# Patient Record
Sex: Male | Born: 1945 | Race: White | Hispanic: Yes | Marital: Married | Smoking: Never smoker
Health system: Northeastern US, Community
[De-identification: ages and names within clinical notes are randomized; demographics above are authoritative.]

---

## 2002-10-02 ENCOUNTER — Ambulatory Visit (HOSPITAL_BASED_OUTPATIENT_CLINIC_OR_DEPARTMENT_OTHER): Payer: Self-pay

## 2002-10-09 ENCOUNTER — Ambulatory Visit (HOSPITAL_BASED_OUTPATIENT_CLINIC_OR_DEPARTMENT_OTHER): Payer: Self-pay

## 2002-10-22 ENCOUNTER — Ambulatory Visit (HOSPITAL_BASED_OUTPATIENT_CLINIC_OR_DEPARTMENT_OTHER): Payer: Self-pay | Admitting: Otolaryngology

## 2003-01-21 ENCOUNTER — Ambulatory Visit (HOSPITAL_BASED_OUTPATIENT_CLINIC_OR_DEPARTMENT_OTHER): Payer: Self-pay | Admitting: Otolaryngology

## 2003-10-06 ENCOUNTER — Ambulatory Visit (HOSPITAL_BASED_OUTPATIENT_CLINIC_OR_DEPARTMENT_OTHER): Payer: Self-pay | Admitting: Otolaryngology

## 2006-12-14 ENCOUNTER — Ambulatory Visit (HOSPITAL_BASED_OUTPATIENT_CLINIC_OR_DEPARTMENT_OTHER): Payer: PRIVATE HEALTH INSURANCE | Admitting: Otolaryngology

## 2006-12-27 ENCOUNTER — Ambulatory Visit (HOSPITAL_BASED_OUTPATIENT_CLINIC_OR_DEPARTMENT_OTHER): Payer: Commercial Managed Care - HMO | Admitting: Ent-Otolaryngology

## 2007-01-31 LAB — SURG SPEC CLINIC NOTE

## 2009-07-15 DIAGNOSIS — R42 Dizziness and giddiness: Secondary | ICD-10-CM | POA: Insufficient documentation

## 2010-07-29 DIAGNOSIS — R42 Dizziness and giddiness: Secondary | ICD-10-CM | POA: Insufficient documentation

## 2014-08-05 DIAGNOSIS — R5383 Other fatigue: Secondary | ICD-10-CM | POA: Insufficient documentation

## 2016-08-09 DIAGNOSIS — H18519 Endothelial corneal dystrophy, unspecified eye: Secondary | ICD-10-CM | POA: Insufficient documentation

## 2018-08-28 LAB — COVID-19 CARE EVERYWHERE: COVID-19 CARE EVERYWHERE: NOT DETECTED

## 2022-04-29 IMAGING — CT CT ABDOMEN AND PELVIS WITHOUT CONTRAST
2 of 3 series · 16 of 46 positions shown, 18 images · non-contrast
Comparison: None

________________________________________________________________________________________________ 
CT ABDOMEN AND PELVIS WITHOUT CONTRAST, 04/29/2022 [DATE]: 
CLINICAL INDICATION: Calculus of ureter. Previous history of kidney stones 5 
years ago. New 
Baseline. 
A search for DICOM formatted images was conducted for prior CT imaging studies 
completed at a non-affiliated media free facility.
TECHNIQUE: The abdomen and pelvis were scanned from lung bases through the 
pubic rami without contrast on a high-resolution CT scanner using dose reduction 
techniques. Routine MPR reconstructions were performed. Count of known CT and 
Cardiac Nuclear Medicine studies performed in the previous 12 months = 0.

[Series 2: abd/pel ax wo · axial · 0.85mm/px · z∈[-507,-51]mm · 13 of 176 slices shown, 15 images]
[im 12/176  soft-tissue]
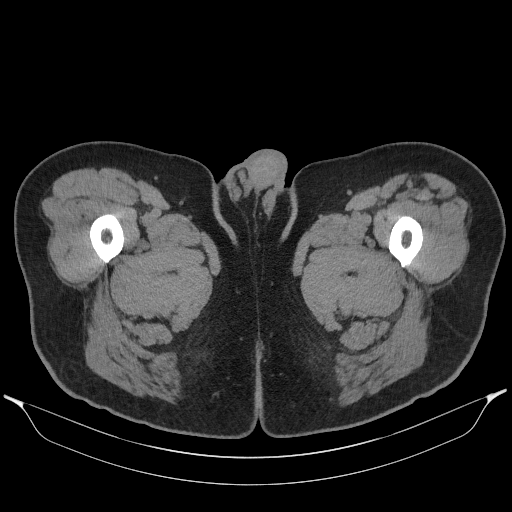
[im 12/176  bone]
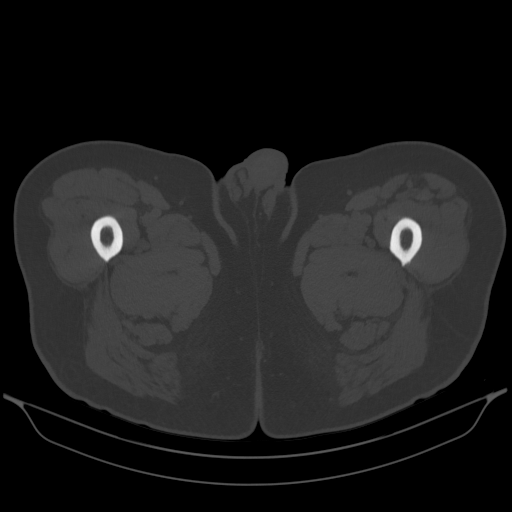
[im 23/176  soft-tissue]
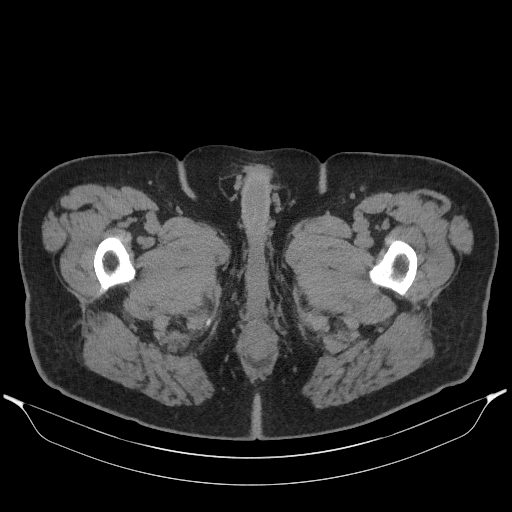
[im 34/176  soft-tissue]
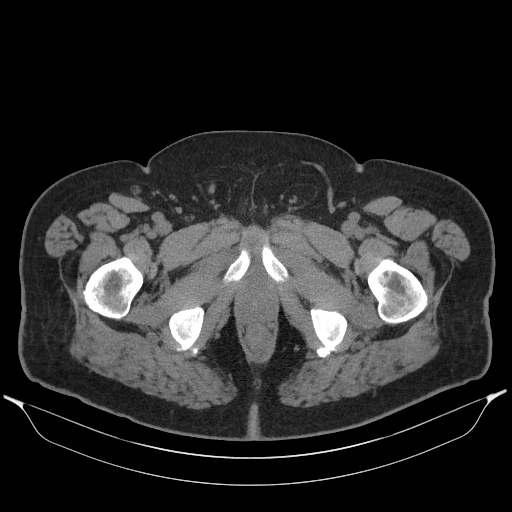
[im 51/176  soft-tissue]
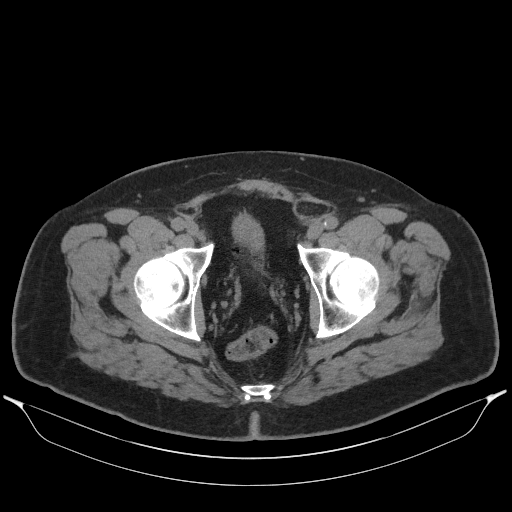
[im 63/176  soft-tissue]
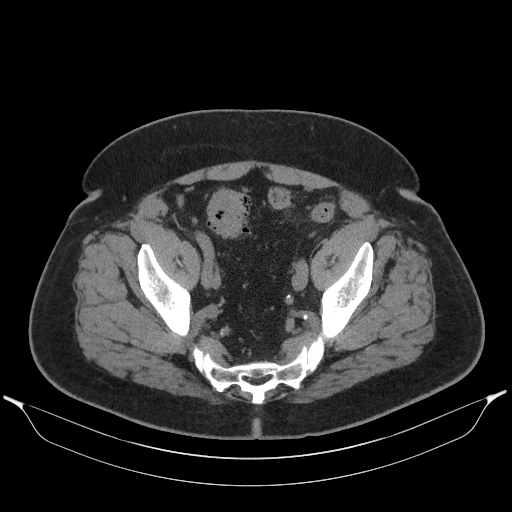
[im 74/176  soft-tissue]
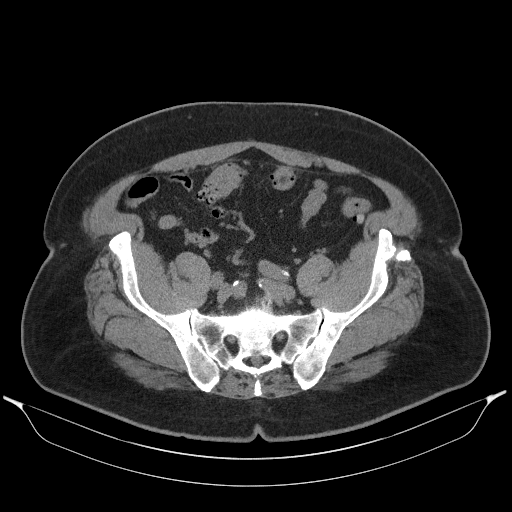
[im 91/176  soft-tissue]
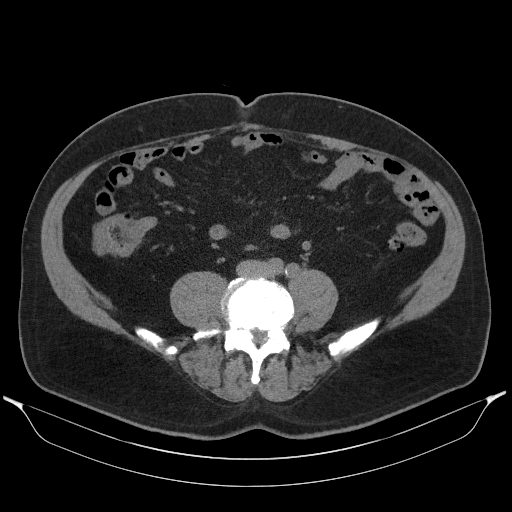
[im 102/176  soft-tissue]
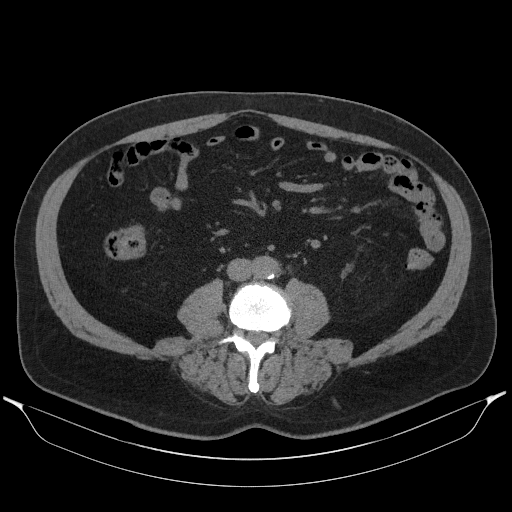
[im 113/176  soft-tissue]
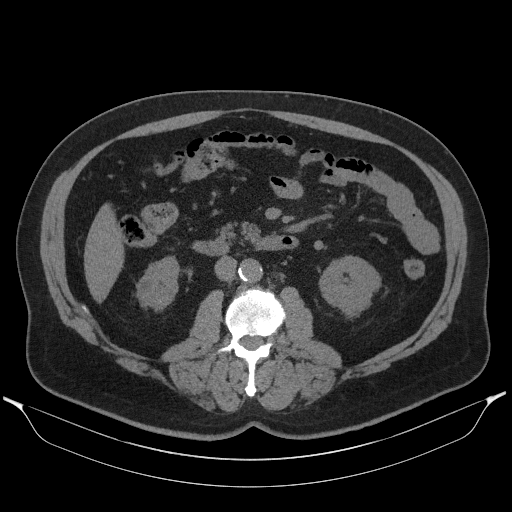
[im 113/176  bone]
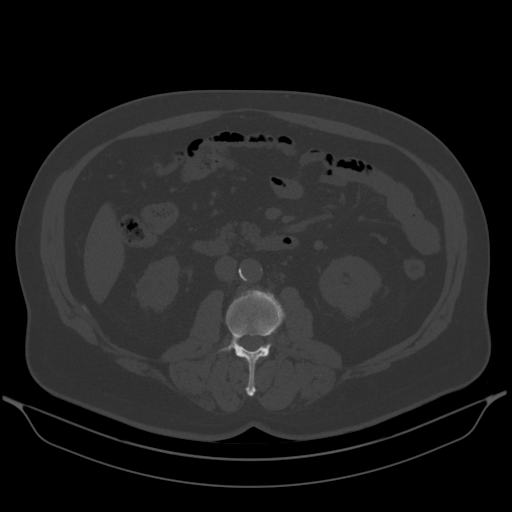
[im 125/176  soft-tissue]
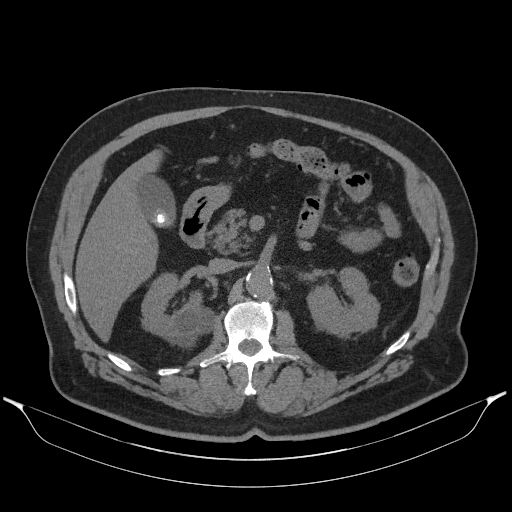
[im 142/176  soft-tissue]
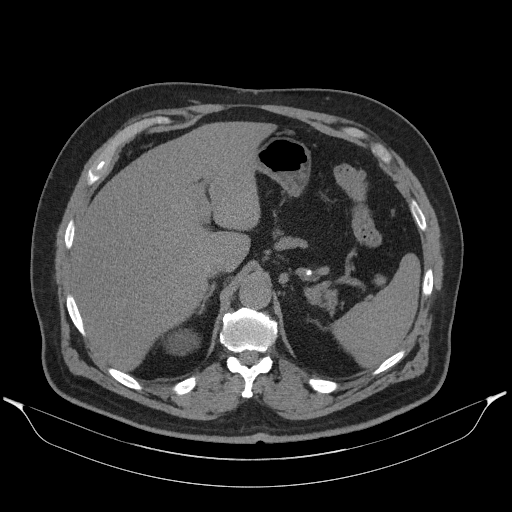
[im 153/176  soft-tissue]
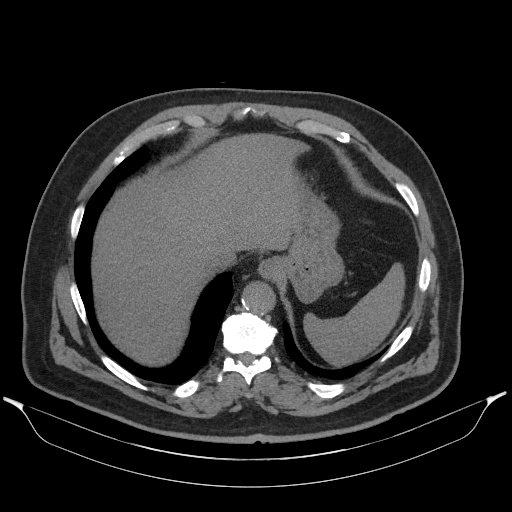
[im 164/176  soft-tissue]
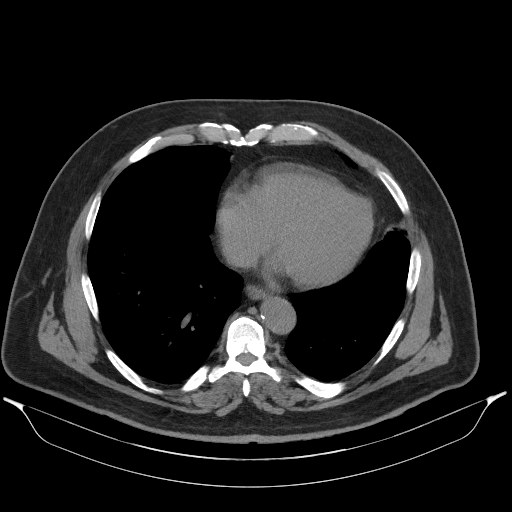

[Series 4: cor from thins · coronal · 0.84mm/px · 3 of 144 slices shown]
[im 48/144  soft-tissue]
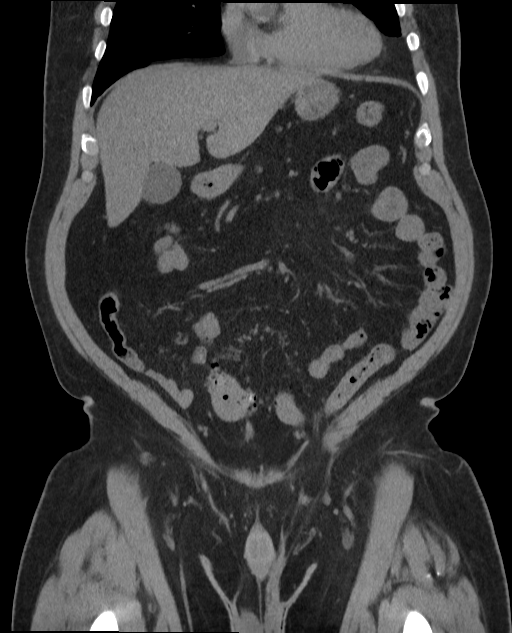
[im 64/144  soft-tissue]
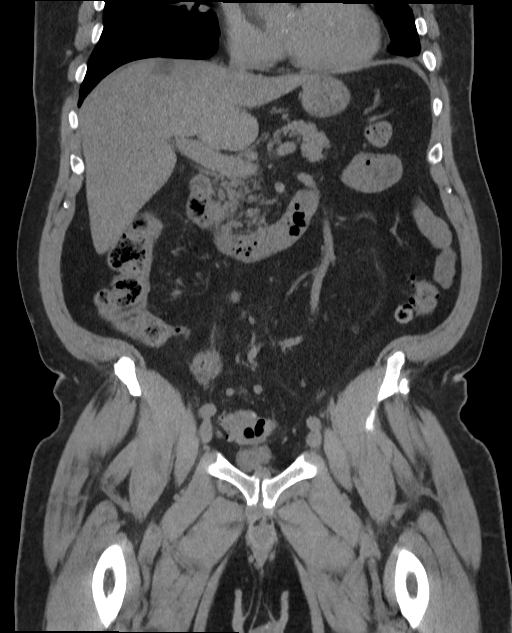
[im 80/144  soft-tissue]
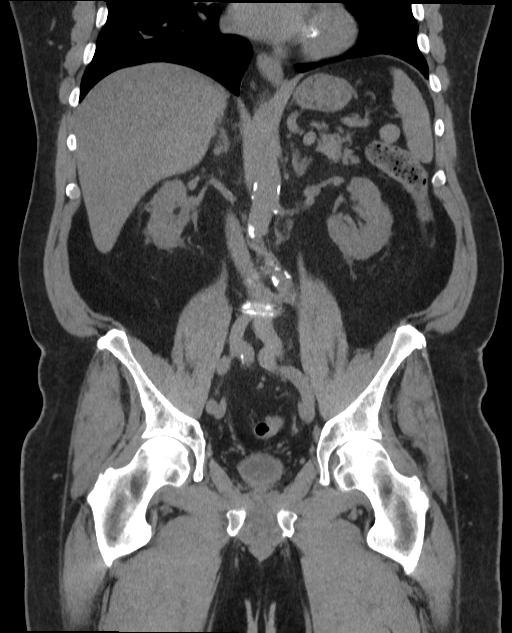

[16 of 46 positions shown; findings below may reference images not displayed]

FINDINGS: LUNG BASES: Lung bases are clear. No pleural effusions. 
HEPATOBILIARY: Liver cysts are present measuring up to 2.3 cm. Non contrast 
images show no mass or biliary dilatation. Multiple gallstones in gallbladder 
without inflammatory changes. 
SPLEEN: Normal in size. 
PANCREAS: No evidence for ductal dilatation or mass.   
ADRENALS: No mass. 
GENITOURINARY: No hydronephrosis or kidney stones. Right renal cysts measuring 
up to 5.4 x 4.5 cm. Bladder is decompressed and difficult to assess without 
gross abnormality. 
LYMPH NODES: No adenopathy. 
STOMACH, SMALL BOWEL AND COLON: No bowel wall thickening or obstruction. Sigmoid 
diverticulosis without active diverticulitis. Stomach is decompressed. 
VASCULAR STRUCTURES: No aneurysm.  
MUSCULOSKELETAL: No acute osseous abnormality. Scattered degenerative changes.  
ADDITIONAL FINDINGS: Small fat-containing left inguinal hernia.
IMPRESSION: Right renal cyst measuring up to 5.4 cm. 
No radiopaque calculi in the collecting systems, ureters or urinary bladder. 
Urinary bladder is decompressed and difficult to assess without gross 
abnormality. 
Gallstones in the gallbladder without inflammatory changes. 
Liver cysts present. 
RADIATION DOSE REDUCTION: All CT scans are performed using radiation dose 
reduction techniques, when applicable.  Technical factors are evaluated and 
adjusted to ensure appropriate moderation of exposure.  Automated dose 
management technology is applied to adjust the radiation doses to minimize 
exposure while achieving diagnostic quality images.

## 2022-07-22 DIAGNOSIS — K828 Other specified diseases of gallbladder: Secondary | ICD-10-CM | POA: Insufficient documentation

## 2022-07-22 DIAGNOSIS — K811 Chronic cholecystitis: Secondary | ICD-10-CM | POA: Insufficient documentation

## 2022-07-22 DIAGNOSIS — N400 Enlarged prostate without lower urinary tract symptoms: Secondary | ICD-10-CM | POA: Insufficient documentation

## 2022-07-22 DIAGNOSIS — K5792 Diverticulitis of intestine, part unspecified, without perforation or abscess without bleeding: Secondary | ICD-10-CM | POA: Insufficient documentation

## 2022-07-28 ENCOUNTER — Telehealth (HOSPITAL_BASED_OUTPATIENT_CLINIC_OR_DEPARTMENT_OTHER): Payer: Self-pay

## 2022-07-28 NOTE — Telephone Encounter (Signed)
error 

## 2022-08-17 DIAGNOSIS — Q7649 Other congenital malformations of spine, not associated with scoliosis: Secondary | ICD-10-CM | POA: Insufficient documentation

## 2022-09-01 NOTE — Progress Notes (Deleted)
error 

## 2022-09-06 ENCOUNTER — Other Ambulatory Visit: Payer: Self-pay

## 2022-09-06 ENCOUNTER — Encounter (HOSPITAL_BASED_OUTPATIENT_CLINIC_OR_DEPARTMENT_OTHER): Payer: Self-pay | Admitting: Neurology

## 2022-09-06 ENCOUNTER — Ambulatory Visit: Payer: Commercial Indemnity | Attending: Neurology | Admitting: Neurology

## 2022-09-06 VITALS — BP 130/83 | HR 60 | Temp 97.6°F | Ht 63.68 in | Wt 219.0 lb

## 2022-09-06 DIAGNOSIS — S0462XS Injury of acoustic nerve, left side, sequela: Secondary | ICD-10-CM | POA: Diagnosis not present

## 2022-09-06 DIAGNOSIS — R42 Dizziness and giddiness: Secondary | ICD-10-CM | POA: Diagnosis not present

## 2022-09-06 MED ORDER — MECLIZINE HCL 12.5 MG PO TABS
12.50 mg | ORAL_TABLET | Freq: Three times a day (TID) | ORAL | 11 refills | Status: AC | PRN
Start: 2022-09-06 — End: 2023-09-06

## 2022-09-06 NOTE — Progress Notes (Signed)
Neurology Consultation/Progress Note          Roderick Pee. Theora Master, MD   09/06/22    [address]     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  PMHx is notable for hearing loss AU, left > right,  anxiety disorder and gout.  He presents today unaccompanied.      He is referred by his PCP in [place] where he and his wife reside most of the year.  They return to [place] for 3 months during the summer.   He reports a long history of "dizziness" which upon description is consistent with a dysequilibrium without vertigo at this time.  He feels off balance and walks "like I'm drunk" at times.  Symptoms are worse when there are changes in barometric pressure or when sick or tired.  He notes that the "dizziness" is exacerbated by sudden changes in head position.       Christian Castro' symptoms began in 2004 short following a varicella zoster infection involving the auditory canal and left lateral neck.  Not long after this viral infection and he developed severe rotational vertigo, nausea and significant hearing loss on the left. 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  His hearing loss have never recovered but the vertigo eventually subsided and he has been left with residual imbalance and a feeling of dysequilibrium every since.  He has been treated with prn Meclizine but found it too sedating.       Pertinent Diagnostics Reviewed:    None for review    ALLERGIES:  Review of Patient's Allergies indicates:  Not on File    CURRENT MEDICATIONS:  Current Outpatient Medications   Medication Sig    pravastatin (PRAVACHOL) 40 MG tablet Take 1 tablet by mouth in the morning.    losartan-hydrochlorothiazide  (HYZAAR) 50-12.5 MG per tablet Take 1 tablet by mouth in the morning.    tadalafil (CIALIS) 5 MG tablet Take 1 tablet by mouth in the morning.    Potassium Bicarb-Citric Acid 10 MEQ TBEF Take 10 mg by mouth in the morning.    allopurinol (ZYLOPRIM) 100 MG tablet Take 1 tablet by mouth in the morning.    famotidine (PEPCID) 40 MG tablet Take 1 tablet by mouth in the morning.    tamsulosin (FLOMAX) 0.4 MG CAPS Capsule Take 0.4 mg by mouth in the morning.    meclizine (ANTIVERT) 12.5 MG TABS Take 1 tablet by mouth every 8 (eight) hours as needed     No current facility-administered medications for this visit.       PAST MEDICAL HISTORY:  Patient Active Problem List:     Chronic cholecystitis     Diverticulitis     Dizziness     Vertigo     Enlarged prostate     Fatigue     Fuchs' corneal dystrophy     Biliary dyskinesia     Bertolotti's syndrome     Vestibular nerve damage, left, sequela, due to a varicella zoster infection 2004     Dysequilibrium  History reviewed. No pertinent surgical history.    FAMILY HISTORY:  History reviewed.  No pertinent family history.      SOCIAL HISTORY:  Social History     Socioeconomic History    Marital status: Married     Spouse name: Not on file    Number of children: Not on file    Years of education: Not on file    Highest education level: Not on file   Occupational History    Not on file   Tobacco Use    Smoking status: Never    Smokeless tobacco: Never   Substance and Sexual Activity    Alcohol use: Not on file    Drug use: Not on file    Sexual activity: Not on file   Other Topics Concern    Not on file   Social History Narrative    Not on file   Social Determinants of Health  Financial Resource Strain: Not on file  Food Insecurity: Not on file  Transportation Needs: Not on file  Physical Activity: Not on file  Stress: Not on file  Social Connections: Not on file  Intimate Partner Violence: Not on file  Housing Stability: Not on file    Review of Systems:   [employer]  review is negative, review HPI for persistent symptoms.    PHYSICAL EXAM:   VITALS:  BP 130/83   Pulse 60   Temp 97.6 F (36.4 C) (Temporal)   Ht 5' 3.68" (1.617 m)   Wt 99.3 kg (219 lb)   SpO2 97%   BMI 37.97 kg/m      General:  pleasant, cooperative.  Appropriately  groomed, well nourished in NAD.  Eyes: No icterus  HEENT: atraumatic, normocephalic  Neck: supple   Skin: no rash, warm, well perfused    Neurologic Assessment:  Mental status: Awake and alert. Oriented to person, place and day, date, month and year.     Recounts elements of the history well.    Language is fluent, responses appropriate. Language comprehension is intact.     CRANIAL NERVES:  Pupils are equal round and reactive to light.  There is no APD.  Visual fields are intact to finger counting bilaterally  Primary gaze is conjugate.  EOM are intact in all gaze directions.  The patient reports no double vision. There is no nystagmus with primary, vertical or horizontal gaze.  There is no INO.  There is no Ptosis.  Face is symmetric at rest and with activation.  Eye closure is 5/5 bilaterally.  Facial sensation is intact to light touch symmetrically.  Hearing is intact to conversation.  Tongue and palate are midline.  There is no dysarthria.        MOTOR FUNCTIONS:    No abnormal movements, involutional movements or tremors are appreciated.   Bulk is overall intact without focal atrophy.  Tone is normal in both upper and lower extremities.  There is no bradykinesia.   Individual Muscle Strength Testing:    Deltoids Biceps Triceps  Grip Finger Spread  Finger Extension  RT:  5   5   5    5      5      5     LT:  5   5    5    5      5      5      Iliopsoas Quad Ham  Anterior Tib Gastroc EHL  RT:  5     5   5    5     5    5   LT:     5     5   5    5     5     5           REFLEXES:      1-2+ in the BUE symmetrically     2 at the patella symmetrically     1+ at the ankles symmetrically    Toes are flexor bilaterally     SENSATION:   Deferred      COORDINATION:   No ataxia on finger-to-nose or heel-knee-shin testing.   GAIT:  Gait: normal stance and posture.   Gait is steady without ataxia, normal pace.         ASSESSMENT/RECOMMENDATIONS:    Problem List Items Addressed This Visit          Symptoms and Signs    Dysequilibrium    Relevant Orders    REFERRAL TO PHYSICAL THERAPY (INT) (Completed)       Other    Vestibular nerve damage, left, sequela, due to a varicella zoster infection 2004 - Primary (Chronic)     # Chronic imbalance and dysequilibrium, onset in 2004 following a left otic zoster infection with resulting left CN VIII damage (permanent as his symptoms have not resolved after 20 years) = vestibular neuropathy.   At this time treatment is symptomatic only.    Start low dose Meclizine 12.5 mg prn "bad days" only; not for daily use.  Referral to PT for visual vestibular rehab  PRN follow up         Relevant Medications    meclizine (ANTIVERT) 12.5 MG TABS       Aamari West R. Theora Master, MD  Diplomate of the Board of Psychiatry and Neurology  Division of Neurology, [facility]    09/06/22        The patient's current medications, including possible side effects, are reviewed with the patient and family if present at today's visit. The patient expressed understanding and no barriers to adherence were identified. The Plan of Care is discussed and/or updated and reviewed with the patient.  The patient is given an After Visit Summary sheet that lists all medications with directions, allergies, orders placed during this encounter, and follow-up instructions.  I reviewed the patient's medical information and medical history.  The medication list is reconciled and needed refills and or new medications are supplied  PMHx, Family Hx, and Social history are reviewed and updated as necessary.   A total of 35 minutes in time was spent on today's encounter (total time includes all activities performed on the date of service) in evaluating and managing this patient.

## 2022-09-06 NOTE — Assessment & Plan Note (Signed)
#   Chronic imbalance and dysequilibrium, onset in 2004 following a left otic zoster infection with resulting left CN VIII damage (permanent as his symptoms have not resolved after 20 years) = vestibular neuropathy.   At this time treatment is symptomatic only.    Start low dose Meclizine 12.5 mg prn "bad days" only; not for daily use.  Referral to PT for visual vestibular rehab  PRN follow up

## 2022-09-08 ENCOUNTER — Ambulatory Visit (HOSPITAL_BASED_OUTPATIENT_CLINIC_OR_DEPARTMENT_OTHER): Payer: Commercial Indemnity | Admitting: Neurology

## 2023-05-14 IMAGING — MR MRI RIGHT SHOULDER WITHOUT CONTRAST
4 of 6 series · 23 of 40 positions shown · IV contrast (gadolinium)
Comparison: None

________________________________________________________________________________________________ 
MRI RIGHT SHOULDER WITHOUT CONTRAST, 05/14/2023 [DATE]: 
CLINICAL INDICATION: Pain In Right Shoulder
TECHNIQUE: Multiplanar, multiecho position MR images of the shoulder were 
performed without intravenous gadolinium enhancement.

[Series 201: survey right · axial · right · 10.0mm · 0.71mm/px · z∈[-40,+125]mm · 5 of 15 slices shown]
[im 1/15]
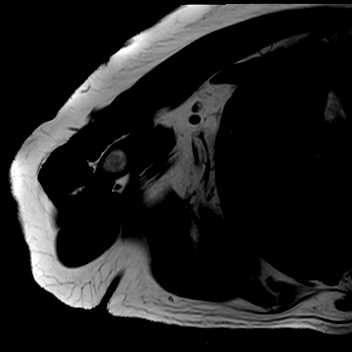
[im 4/15]
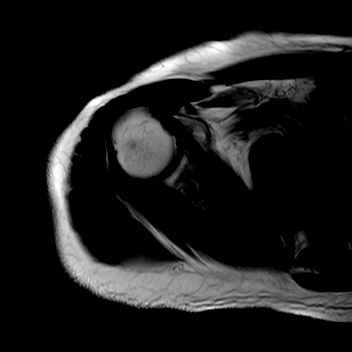
[im 8/15]
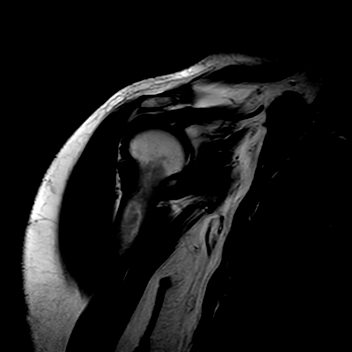
[im 11/15]
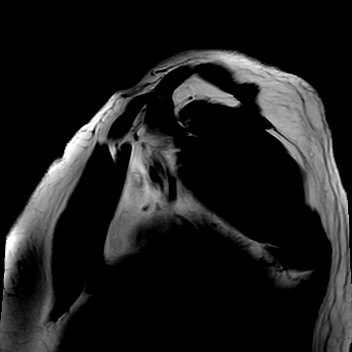
[im 15/15]
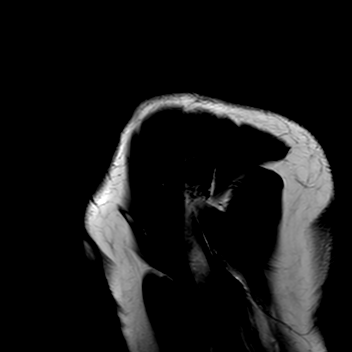

[Series 301: (person_name)_(person_name)_(person_name) right · axial · right · 3.5mm · 0.42mm/px · z∈[-32,+75]mm · 7 of 28 slices shown]
[im 1/28]
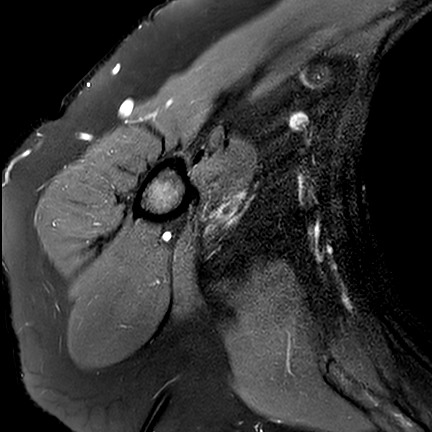
[im 5/28]
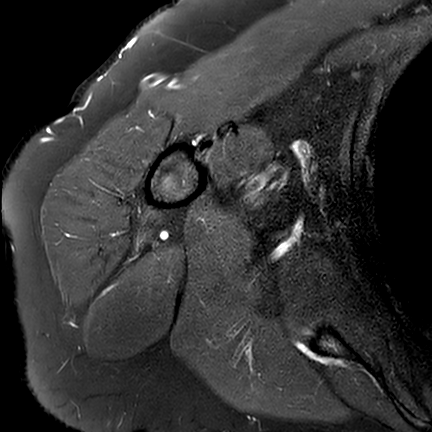
[im 10/28]
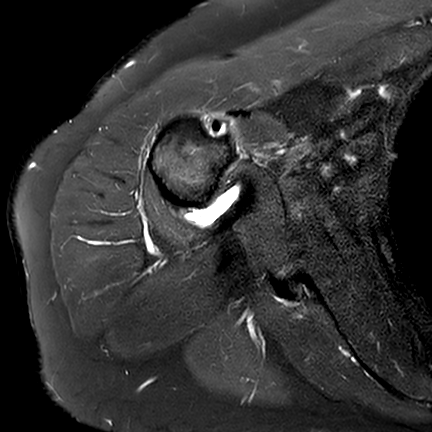
[im 14/28]
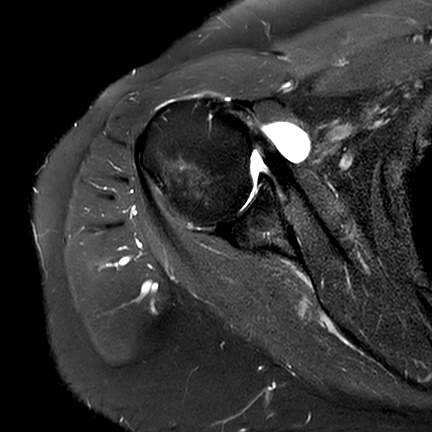
[im 19/28]
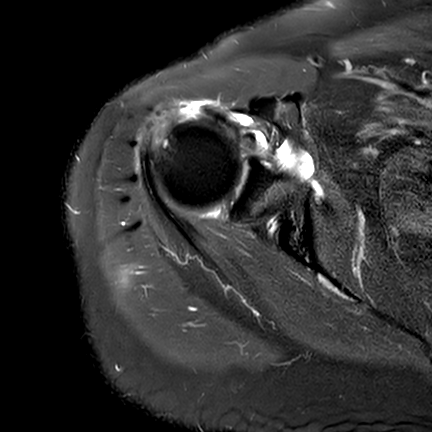
[im 23/28]
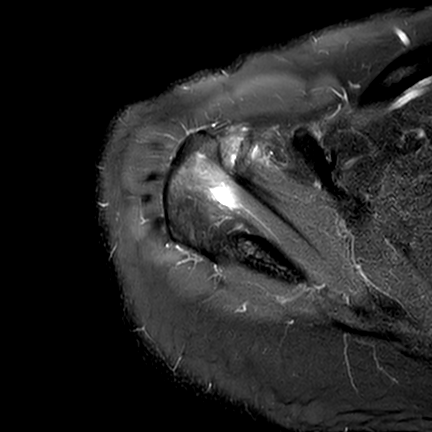
[im 28/28]
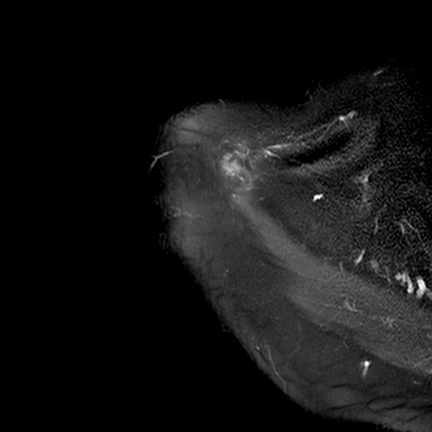

[Series 401: t2_fs_sag right · oblique · right · 3.5mm · 0.35mm/px · 8 of 30 slices shown]
[im 1/30]
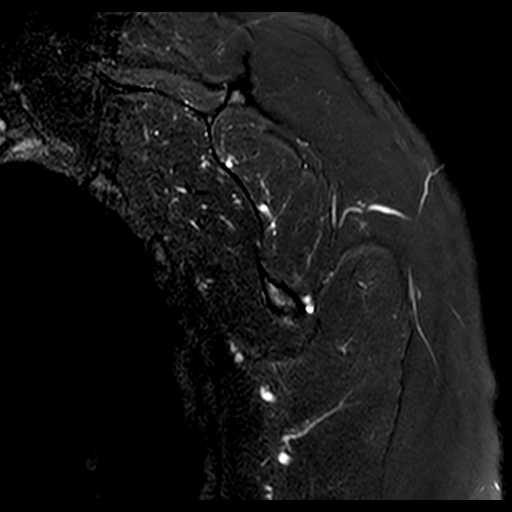
[im 5/30]
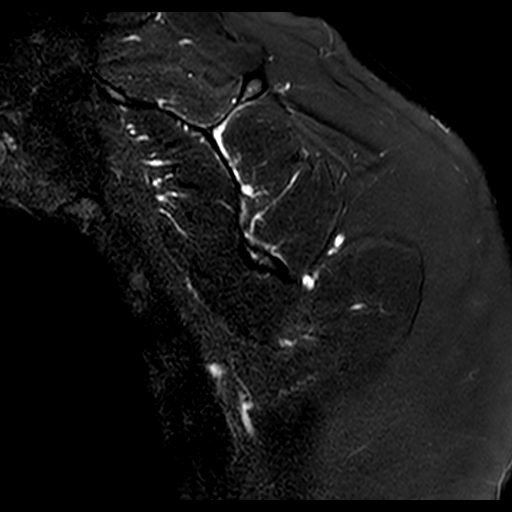
[im 9/30]
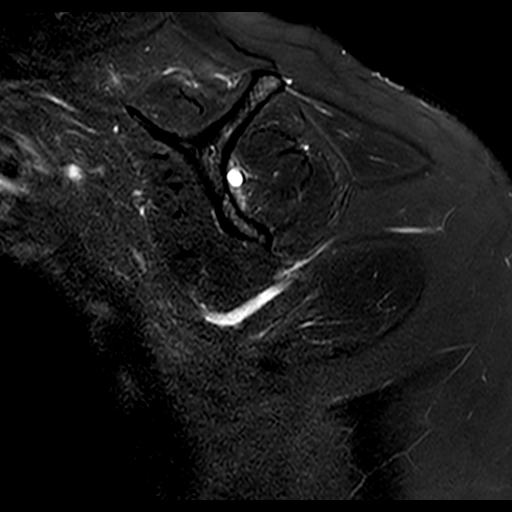
[im 13/30]
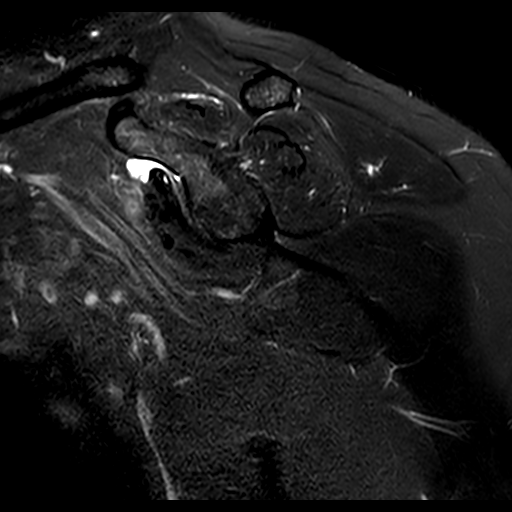
[im 17/30]
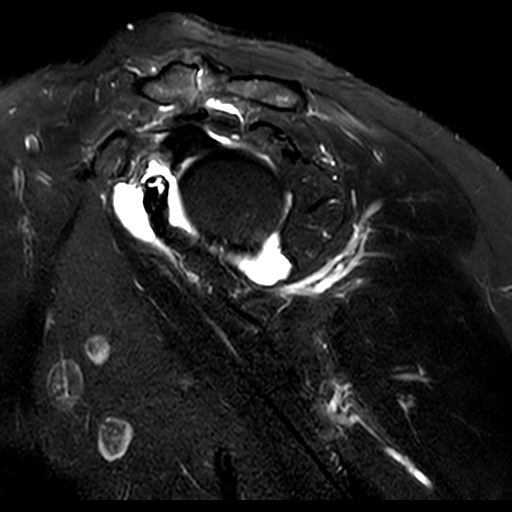
[im 21/30]
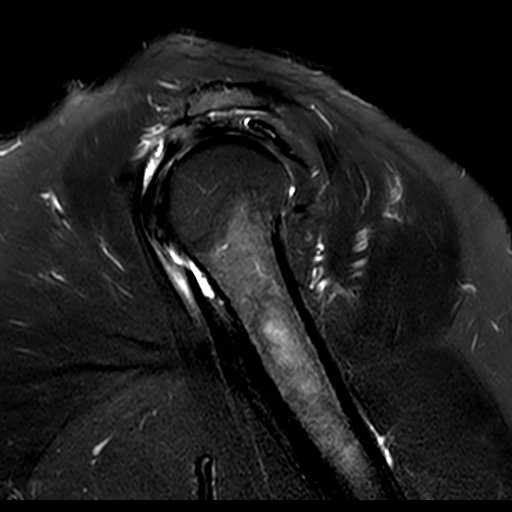
[im 25/30]
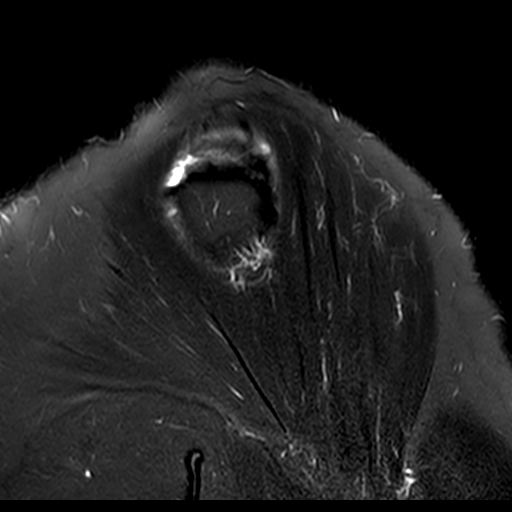
[im 30/30]
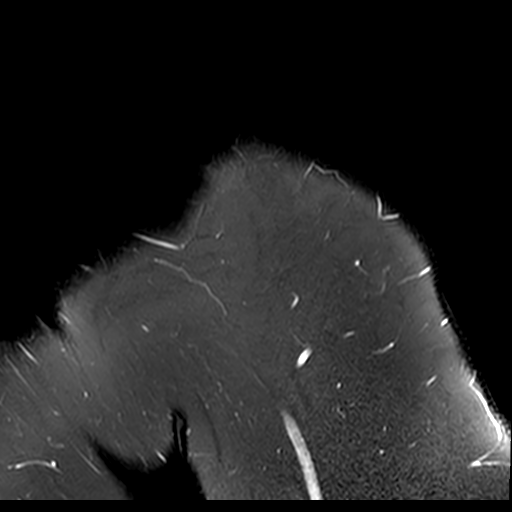

[Series 501: pd_fs_cor right · oblique · right · 3.5mm · 0.33mm/px · 3 of 24 slices shown]
[im 5/24]
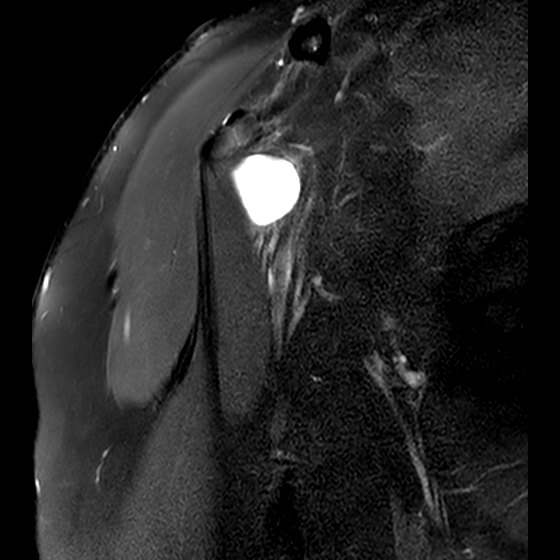
[im 14/24]
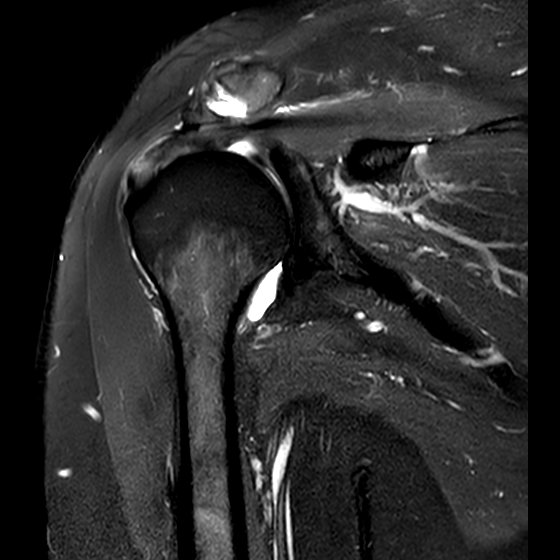
[im 24/24]
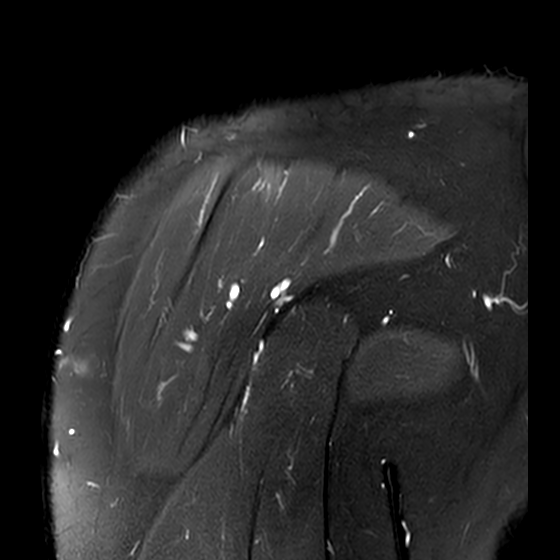

[23 of 40 positions shown; findings below may reference images not displayed]

FINDINGS: ROTATOR CUFF: Full-thickness tear of the distal supraspinatus tendon measures 
1.3 cm in AP dimensions with up to 1.0 cm medial retraction of the tendinous 
remnant. High-grade partial-thickness tear of the superior fibers of the distal 
subscapularis tendon measure up to 0.5 cm in height. Trace amount of fluid 
extends along the infraspinatus myotendinous junction. The  infraspinatus and 
teres minor tendons are intact. The rotator cuff musculature is symmetric 
without mass, signal abnormality or atrophy. 
ACROMIOCLAVICULAR JOINT: Mild degenerative change of the acromioclavicular joint 
and joint effusion without mass effect upon the torn supraspinatus. The 
coracoacromial ligament is intact without prominent spurring at the acromial 
attachment. The acromioclavicular and coracoclavicular ligaments are preserved. 
The acromium is normal in morphology. 
GLENOHUMERAL JOINT: The humeral head is well located within the glenoid fossa. 
Articular cartilage is preserved.  The glenoid labrum is preserved. No 
paralabral cyst. The intra-articular portion of the long head of the biceps 
tendon is negative. Moderate joint effusion. 
BONES: The bone marrow signal intensity is negative for fracture. No Hill-Sachs 
defect. Subcortical cystic change of the humeral head. 
ADDITIONAL FINDINGS: The axillary region is negative. Subcutaneous tissues are 
negative.
IMPRESSION: 1.  1.3 cm full-thickness distal supraspinatus tendon tear.  
2.  0.5 cm high-grade partial-thickness distal subscapularis tendon tear.  
3.  Mild AC joint degenerative change and joint effusion without mass effect 
upon the supraspinatus. 
4.  Moderate glenohumeral joint effusion.
